# Patient Record
Sex: Male | Born: 1970 | Race: White | Hispanic: No | Marital: Married | Smoking: Never smoker
Health system: Southern US, Community
[De-identification: ages and names within clinical notes are randomized; demographics above are authoritative.]

---

## 2010-06-28 ENCOUNTER — Ambulatory Visit: Payer: Self-pay | Admitting: Sports Medicine

## 2010-06-28 DIAGNOSIS — M25569 Pain in unspecified knee: Secondary | ICD-10-CM

## 2011-01-02 ENCOUNTER — Ambulatory Visit
Admission: RE | Admit: 2011-01-02 | Discharge: 2011-01-02 | Payer: Self-pay | Source: Home / Self Care | Attending: Sports Medicine | Admitting: Sports Medicine

## 2011-01-02 DIAGNOSIS — M2170 Unequal limb length (acquired), unspecified site: Secondary | ICD-10-CM | POA: Insufficient documentation

## 2011-01-09 NOTE — Assessment & Plan Note (Signed)
Summary: LEG INJURY,MC   Vital Signs:  Patient profile:   40 year old male Height:      69 inches Weight:      177 pounds BMI:     26.23 Pulse rate:   67 / minute BP sitting:   130 / 75  (left arm) Cuff size:   regular  Vitals Entered By: Tessie Fass CMA (June 28, 2010 2:20 PM) CC: right knee injury   CC:  right knee injury.  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  crepitation noted along leteral patella and capsule and slight crepitation w flex/ext    Impression & Recommendations:  Problem # 1:  KNEE PAIN, RIGHT (ICD-719.46) suspect he had a cartilage contusion or other soft tissue injuey this knee has some residual crepitation from old surgery and injury  advised that this should continue healing over next 7 to 10 days if worsening or no change he will return and we will scan  OK to do any activity tolerated

## 2011-01-11 NOTE — Assessment & Plan Note (Signed)
Summary: ORTHOTICS/RT KNEE PAIN/RUNNER/MJD   Vital Signs:  Patient profile:   40 year old male Height:      69 inches Weight:      170 pounds BMI:     25.20 Pulse rate:   73 / minute BP sitting:   138 / 89  (right arm)  Vitals Entered By: Rochele Pages RN (January 02, 2011 10:03 AM) CC: rt lateral knee pain   CC:  rt lateral knee pain.  History of Present Illness: used to be a competitive runner.  injury to knee and surgery 1993, leg length discrepancy, orthotics and lift.  15 years since was a serious runner.  restarted in december now 5-10 week.  no pain until knee injury.  right knee making a clicking sound.  no weakness in knee, no locking.  hx of arthroscopy 15 years ago.  right knee- twisted knee in yard, lateral knee pain, worse going down stairs.  getting better in t erms of pain.  now hurts with full flexion and with heel strike. taking motrin for pain.  no sweelling at time fo injury  Preventive Screening-Counseling & Management  Alcohol-Tobacco     Smoking Status: never  Current Medications (verified): 1)  None  Allergies (verified): No Known Drug Allergies  Social History: Smoking Status:  never  Review of Systems  The patient denies fever, weight loss, and syncope.    Physical Exam  General:  Well-developed,well-nourished,in no acute distress; alert,appropriate and cooperative throughout examination Msk:  right knee with plica, crepitus.  ligaments intact with normal McMurray, lockman.  no swelling or heat.   rt leg slightly longer than left   Impression & Recommendations:  Problem # 1:  KNEE PAIN, RIGHT (ICD-719.46) Assessment Unchanged  risk of arthritic changes high in longer right leg, especially given previous surgery.   no evidence of acute injury.  will make orthotics today to provide support and correct leg lenght.   Orders: Orthotic Materials, each unit [phone])  Problem # 2:  UNEQUAL LEG LENGTH (ICD-736.81)  Patient was fitted for a :  standard, cushioned, semi-rigid orthotic. The orthotic was heated and afterward the patient stood on the orthotic blank positioned on the orthotic stand. The patient was positioned in subtalar neutral position and 10 degrees of ankle dorsiflexion in a weight bearing stance. After completion of molding, a stable base was applied to the orthotic blank. The blank was ground to a stable position for weight bearing. Size: 11 Base: blue swirl Posting: blue EVA Additional orthotic padding: Red brick with medial wedge on left.  Gait neutral with orthotics  even though his leg length is only 1 cm or so different this affects his gait and seems to trigger his knee pain  will follow up to see if this lessens his pain p 6 to 7 wks of use  Orders: Orthotic Materials, each unit (L3002)   Orders Added: 1)  Est. Patient Level III [60454] 2)  Orthotic Materials, each unit [L3002]

## 2011-03-09 ENCOUNTER — Inpatient Hospital Stay (INDEPENDENT_AMBULATORY_CARE_PROVIDER_SITE_OTHER)
Admission: RE | Admit: 2011-03-09 | Discharge: 2011-03-09 | Disposition: A | Discharge status: Home / Self Care | Payer: Self-pay | Source: Ambulatory Visit | Attending: Emergency Medicine | Admitting: Emergency Medicine

## 2011-03-09 DIAGNOSIS — J029 Acute pharyngitis, unspecified: Secondary | ICD-10-CM

## 2011-03-10 LAB — STREP A DNA PROBE

## 2013-06-20 ENCOUNTER — Emergency Department (HOSPITAL_COMMUNITY)
Admission: EM | Admit: 2013-06-20 | Discharge: 2013-06-21 | Disposition: A | Discharge status: Home / Self Care | Payer: BC Managed Care – PPO | Attending: Emergency Medicine | Admitting: Emergency Medicine

## 2013-06-20 DIAGNOSIS — W261XXA Contact with sword or dagger, initial encounter: Secondary | ICD-10-CM | POA: Insufficient documentation

## 2013-06-20 DIAGNOSIS — Y93G1 Activity, food preparation and clean up: Secondary | ICD-10-CM | POA: Insufficient documentation

## 2013-06-20 DIAGNOSIS — W260XXA Contact with knife, initial encounter: Secondary | ICD-10-CM | POA: Insufficient documentation

## 2013-06-20 DIAGNOSIS — S61209A Unspecified open wound of unspecified finger without damage to nail, initial encounter: Secondary | ICD-10-CM | POA: Insufficient documentation

## 2013-06-20 DIAGNOSIS — Y929 Unspecified place or not applicable: Secondary | ICD-10-CM | POA: Insufficient documentation

## 2013-06-20 NOTE — ED Notes (Signed)
Quick Clot applied per PA

## 2013-06-20 NOTE — ED Provider Notes (Signed)
   History  This chart was scribed for non-physician practitioner Francee Piccolo, PA-C, working with No att. providers found, by Yevette Edwards, ED Scribe. This patient was seen in room TR07C/TR07C and the patient's care was started at 10:04 PM.  CSN: [id number] Arrival date & time 06/20/13  2144  None    Chief Complaint  Patient presents with  . Laceration    The history is provided by the patient. No language interpreter was used.   HPI Comments: Joel Irwin is a 42 y.o. male who presents to the Emergency Department complaining of a laceration to his left thumb which occurred around an hour PTA when he was shaving an onion with a knife. The pt states that he sought medical attention because of uncontrolled bleeding to his thumb. He denies pain or numbness or tingling to his thumb or hand Denies fevers or chills or decreased sensation or ROM of thumb. His last tetanus was less than five years ago.   No past medical history on file. No past surgical history on file. No family history on file. History  Substance Use Topics  . Smoking status: Not on file  . Smokeless tobacco: Not on file  . Alcohol Use: Not on file    Review of Systems  Constitutional: Negative for fever and chills.  Skin: Positive for wound.  Neurological: Negative for numbness.  All other systems reviewed and are negative.    Allergies  Review of patient's allergies indicates no known allergies.  Home Medications   Current Outpatient Rx  Name  Route  Sig  Dispense  Refill  . Multiple Vitamins-Minerals (MULTIVITAMIN PO)   Oral   Take 1 tablet by mouth daily.          Triage Vitals: BP 139/80  Pulse 82  Temp(Src) 98.3 F (36.8 C) (Oral)  Resp 18  SpO2 98%  Physical Exam  Nursing note and vitals reviewed. Constitutional: He is oriented to person, place, and time. He appears well-developed and well-nourished. No distress.  HENT:  Head: Normocephalic and atraumatic.  Eyes: Conjunctivae  are normal.  Neck: Neck supple.  Musculoskeletal:       Left hand: He exhibits laceration. He exhibits normal range of motion, no tenderness, no bony tenderness, normal two-point discrimination, normal capillary refill, no deformity and no swelling. Normal sensation noted. Normal strength noted.  Less than 0.5 cm area of skin avulsion. No FB or bone visualized.   Neurological: He is alert and oriented to person, place, and time.  Skin: Skin is warm and dry. He is not diaphoretic.  Psychiatric: He has a normal mood and affect.    ED Course  Procedures (including critical care time)  DIAGNOSTIC STUDIES: Oxygen Saturation is 98% on room air, normal by my interpretation.    COORDINATION OF CARE:  10:30 PM- Discussed treatment plan with pt, and the pt agreed.   Labs Reviewed - No data to display No results found. 1. Avulsion of skin of finger without complication, initial encounter     MDM  Skin avulsion noted on examination, no bone or FB noted. Neurovascularly intact. Bleeding controlled in ED. Wound cleansed. Tdap UTD. Return precautions discussed. Advised f/u w/ PCP. Patient is agreeable to plan. Patient is stable at time of discharge    I personally performed the services described in this documentation, which was scribed in my presence. The recorded information has been reviewed and is accurate.     Lise Auer Monterrio Gerst, PA-C 06/20/13 2350

## 2013-06-20 NOTE — ED Notes (Signed)
Pt c/o LAC left thumb when shaving and onion. Last tetanus less than 5 years.

## 2013-06-20 NOTE — ED Provider Notes (Signed)
Medical screening examination/treatment/procedure(s) were performed by non-physician practitioner and as supervising physician I was immediately available for consultation/collaboration.   Richardean Canal, MD 06/20/13 [phone]

## 2015-02-23 ENCOUNTER — Ambulatory Visit
Admission: RE | Admit: 2015-02-23 | Discharge: 2015-02-23 | Disposition: A | Discharge status: Home / Self Care | Payer: BC Managed Care – PPO | Source: Ambulatory Visit | Attending: Sports Medicine | Admitting: Sports Medicine

## 2015-02-23 ENCOUNTER — Ambulatory Visit (INDEPENDENT_AMBULATORY_CARE_PROVIDER_SITE_OTHER): Payer: BC Managed Care – PPO | Admitting: Sports Medicine

## 2015-02-23 ENCOUNTER — Encounter: Payer: Self-pay | Admitting: Sports Medicine

## 2015-02-23 VITALS — BP 126/64 | Ht 69.0 in | Wt 165.0 lb

## 2015-02-23 DIAGNOSIS — M25461 Effusion, right knee: Secondary | ICD-10-CM

## 2015-02-23 DIAGNOSIS — M25561 Pain in right knee: Secondary | ICD-10-CM | POA: Diagnosis not present

## 2015-02-23 MED ORDER — MELOXICAM 15 MG PO TABS: ORAL_TABLET | ORAL | Status: AC

## 2015-03-02 DIAGNOSIS — M25469 Effusion, unspecified knee: Secondary | ICD-10-CM | POA: Insufficient documentation

## 2015-03-02 NOTE — Progress Notes (Signed)
  Joel SievertDenny L Pinedo - 44 y.o. male MRN [id number]  Date of birth: 11/28/1971  CHIEF COMPLAINT:  Right knee pain, initial evaluation  HISTORY OF PRESENT ILLNESS:  one week of acute knee pain and swelling after participating with his children at a trampoline park. Reports stiffness and swelling and symptoms of instability without ever giving way correct ever to No clicking, popping, locking or giving way. Has tried taking ibuprofen without significant improvement   REVIEW OF SYSTEMS:  denies numbness, tingling, luxury weakness. No significant diagnosed prior knee injuries but does report prior injury approximately 10 years ago with significant swelling after an incident of his knee giving away during a cutting and pivoting activity.   HISTORY:  otherwise healthy, non-smoker.  OBJECTIVE:  VS:   HT:5\' 9"  (175.3 cm)   WT:165 lb (74.844 kg)  BMI:24.4          BP:126/64 mmHg  HR: bpm  TEMP: ( )  RESP:   EXAM:  adult caucasian male.  No acute distress.  He is alert and appropriately interactive. RIGHT KNEE: moderate fusion, overall well line, no significant bruising.  No medial or lateral joint line tenderness.  Stable to varus and valgus strain. He does have laxity with a poor end point with anterior drawer and Lachman's.  Stable posterior drawer.  Negative McMurray's, negative Thessaly.    PROCEDURE NOTE : right knee arthrocentesis without Injection After discussing the risks, benefits and expected outcomes of the injection and all questions were reviewed and answered,  he wished to undergo the above named procedure.  Written consent was obtained. After an appropriate time out was taken the right knee was sterilely prepped and injected as below: Prep:    Betadine and alcohol,  Ethel chloride.  Approach:  Superior lateral Needle:  27g 1.5 inch needle for numbing, 18g 1.5 inch needle for aspiration. Meds:   None ~30cc straw-colored, clear fluid aspirated without difficulty A bandaid was  applied to the area. This procedure was well tolerated and there were no complications.     ASSESSMENT: 1. Pain in joint, lower leg, right   2. Knee effusion, right    Problem  Knee Effusion   concerns for potential of this damage from prior injury vs. degenerative changes. If not improved with aspiration, anti-inflammatories, resting, compression consider further advanced imaging       PLAN: See problem based charting & AVS for additional documentation. arthrocentesis today, compression bandage supplied. Start meloxicam. Obtain x-rays consider further evaluation at follow-up > Return in about 2 weeks (around 03/09/2015).   Meds ordered this encounter  Medications  . meloxicam (MOBIC) 15 MG tablet    Sig: Take one a day for 2 weeks then prn    Dispense:  30 tablet    Refill:  0    Orders Placed This Encounter  Procedures  . DG Knee Complete 4 Views Right

## 2015-03-23 ENCOUNTER — Ambulatory Visit: Payer: BC Managed Care – PPO | Admitting: Sports Medicine

## 2015-04-06 ENCOUNTER — Ambulatory Visit (INDEPENDENT_AMBULATORY_CARE_PROVIDER_SITE_OTHER): Payer: BC Managed Care – PPO | Admitting: Sports Medicine

## 2015-04-06 ENCOUNTER — Encounter: Payer: Self-pay | Admitting: Sports Medicine

## 2015-04-06 VITALS — BP 118/90 | Ht 69.0 in | Wt 165.0 lb

## 2015-04-06 DIAGNOSIS — M25461 Effusion, right knee: Secondary | ICD-10-CM | POA: Diagnosis not present

## 2015-04-06 DIAGNOSIS — M25561 Pain in right knee: Secondary | ICD-10-CM | POA: Diagnosis not present

## 2015-04-06 MED ORDER — METHYLPREDNISOLONE ACETATE 40 MG/ML IJ SUSP
40.0000 mg | Freq: Once | INTRAMUSCULAR | Status: AC
  Administered 2015-04-06: 40 mg via INTRA_ARTICULAR

## 2015-04-07 NOTE — Progress Notes (Signed)
  Joel Irwin - 44 y.o. male MRN [id number]  Date of birth: 12/13/1970  SUBJECTIVE: CC: 1.  right knee pain, follow-up      HPI: Interval history: Reports pain was moderate to severe today following aspiration but since and has significantly improved and he has been able to return to some moderate level of activity. Denies any significant clicking, popping or locking but is noted to swelling has been persistent. Denies any symptoms of instability. Meloxicam has been helpful.     ROS:  per HPI    HISTORY:  Past Medical, Surgical, Social, and Family History reviewed & updated per EMR.  Pertinent Historical Findings include: Social History   Occupational History  . Not on file.   Social History Main Topics  . Smoking status: Never Smoker   . Smokeless tobacco: Not on file  . Alcohol Use: Not on file  . Drug Use: Not on file  . Sexual Activity: Not on file    No specialty comments available. Problem  KNEE PAIN, RIGHT   Injury approximately 10 years ago, consistent with potential ACL injury Standing right knee x-ray 02/23/2015: No evidence of fracture, dislocation or significant effusion. No significant medial or lateral joint line degenerative change.     OBJECTIVE:  VS:   HT:5\' 9"  (175.3 cm)   WT:165 lb (74.844 kg)  BMI:24.4          BP:118/90 mmHg  HR: bpm  TEMP: ( )  RESP:   PHYSICAL EXAM: adult caucasian male.  No acute distress.  He is alert and appropriately interactive. RIGHT KNEE: moderate effusion but less than prior visit, overall well aligned, no significant bruising.  No medial or lateral joint line tenderness.  Stable to varus and valgus strain. He does have laxity with a moderate end point with anterior drawer and Lachman's.  Stable posterior drawer.  Negative McMurray's, negative Thessaly.     ASSESSMENT & PLAN: See problem based charting & AVS for additional documentation Problem List Items Addressed This Visit    KNEE PAIN, RIGHT - Primary    PROCEDURE  NOTE : Right knee aspiration and injection After discussing the risks, benefits and expected outcomes of the injection and all questions were reviewed and answered,  he wished to undergo the above named procedure.  Written consent was obtained. After an appropriate time out was taken the right knee was sterilely prepped and injected as below: Prep:    Betadine and alcohol,  Ethel chloride.  Anesthetic:  5 mL 1% lidocaine using 25-gauge 1.5 inch needle Approach:  Superior lateral Needle:  18-gauge Aspirate:  25 mL of straw-colored fluid Meds:   1 mL of 40 mg Depo-Medrol, 1 mL of half percent Marcaine 1 mL of 1% lidocaine A bandaid was applied to the area. This procedure was well tolerated and there were no complications.    Body Helix right knee compression sleeve provided today. Discussed using during activity as well as for the first 2 hours following activity and PRN.  Quadricep exercises, body helix compression sleeve. We'll see him back on an as needed basis.   If any significant worsening or no significant improvement MRI of the right knee would be indicated in he can call to have this scheduled prior to his next visit.      Relevant Medications   methylPREDNISolone acetate (DEPO-MEDROL) injection 40 mg (Completed)   Knee effusion      FOLLOW UP:  No Follow-up on file.

## 2015-04-07 NOTE — Assessment & Plan Note (Addendum)
PROCEDURE NOTE : Right knee aspiration and injection After discussing the risks, benefits and expected outcomes of the injection and all questions were reviewed and answered,  he wished to undergo the above named procedure.  Written consent was obtained. After an appropriate time out was taken the right knee was sterilely prepped and injected as below: Prep:    Betadine and alcohol,  Ethel chloride.  Anesthetic:  5 mL 1% lidocaine using 25-gauge 1.5 inch needle Approach:  Superior lateral Needle:  18-gauge Aspirate:  25 mL of straw-colored fluid Meds:   1 mL of 40 mg Depo-Medrol, 1 mL of half percent Marcaine 1 mL of 1% lidocaine A bandaid was applied to the area. This procedure was well tolerated and there were no complications.    Body Helix right knee compression sleeve provided today. Discussed using during activity as well as for the first 2 hours following activity and PRN.  Quadricep exercises, body helix compression sleeve. We'll see him back on an as needed basis.   If any significant worsening or no significant improvement MRI of the right knee would be indicated in he can call to have this scheduled prior to his next visit.

## 2017-10-15 ENCOUNTER — Ambulatory Visit: Payer: BC Managed Care – PPO | Admitting: Sports Medicine

## 2017-10-15 ENCOUNTER — Encounter: Payer: Self-pay | Admitting: Sports Medicine

## 2017-10-15 VITALS — BP 120/78 | Ht 69.0 in | Wt 175.0 lb

## 2017-10-15 DIAGNOSIS — M25461 Effusion, right knee: Secondary | ICD-10-CM | POA: Diagnosis not present

## 2017-10-15 DIAGNOSIS — M25561 Pain in right knee: Secondary | ICD-10-CM

## 2017-10-15 MED ORDER — DICLOFENAC SODIUM 75 MG PO TBEC: DELAYED_RELEASE_TABLET | ORAL | 0 refills | Status: AC

## 2017-10-16 ENCOUNTER — Encounter: Payer: Self-pay | Admitting: Sports Medicine
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
## 2017-10-29 ENCOUNTER — Ambulatory Visit (INDEPENDENT_AMBULATORY_CARE_PROVIDER_SITE_OTHER): Payer: BC Managed Care – PPO | Admitting: Sports Medicine

## 2017-10-29 ENCOUNTER — Encounter: Payer: Self-pay | Admitting: Sports Medicine

## 2017-10-29 VITALS — BP 119/77 | Ht 69.0 in | Wt 175.0 lb

## 2017-10-29 DIAGNOSIS — M25561 Pain in right knee: Secondary | ICD-10-CM

## 2017-10-29 NOTE — Progress Notes (Signed)
   Subjective:    Patient ID: Joel Irwin, male    DOB: 10/01/1971, 46 y.o.   MRN: [id number]  HPI   Joel Irwin comes in today for follow-up on his right knee injury. He was about 70% better until he went roller skating at his child's birthday party this past weekend. He is now about 50% better since his last office visit 2 weeks ago. He describes a generalized soreness in the knee when he is on his knee for extended periods of time. His swelling has subsided. He has discontinued his compression sleeve. He found it to be somewhat uncomfortable. He took his Voltaren for 7 days without any problems. He admits that he has not been compliant with his home exercises. He denies any locking or catching of the knee. Some slight instability but not bad.    Review of Systems As above    Objective:   Physical Exam  Well-developed, well-nourished. No acute distress. Awake alert and oriented 3. Vital signs reviewed  Right knee: Full range of motion. Previous effusion has resolved. Slight tenderness to palpation along the medial joint line with a mildly positive Thessaly's. No tenderness along the lateral joint line. Knee is stable to valgus and varus stressing but valgus stressing does cause some mild medial discomfort. Patient still has a 2+ anterior drawer and a 2+ Lachman. Neurovascularly intact distally. Walking with a slight limp.      Assessment & Plan:   Improving right knee pain  Overall, patient is improving. I discussed the merits of MRI but the patient would like to wait on that for now. I think he has a chronically torn anterior cruciate ligament and he may have a new meniscal tear but since he is improving we will simply stick with conservative treatment for now. I've encouraged him to be more compliant with his home exercises and I discussed the possibility of purchasing a double upright brace if he needs it for better support. He can also use his Voltaren as needed for pain. He may start to  increase activity as tolerated. If he does not return to his baseline over the next 3-4 weeks then I would reconsider merits of diagnostic imaging. Patient will follow-up for ongoing or recalcitrant issues.

## 2018-07-25 ENCOUNTER — Other Ambulatory Visit: Payer: Self-pay | Admitting: Family Medicine

## 2018-07-25 DIAGNOSIS — R918 Other nonspecific abnormal finding of lung field: Secondary | ICD-10-CM

## 2018-07-28 ENCOUNTER — Ambulatory Visit
Admission: RE | Admit: 2018-07-28 | Discharge: 2018-07-28 | Disposition: A | Discharge status: Home / Self Care | Payer: BC Managed Care – PPO | Source: Ambulatory Visit | Attending: Family Medicine | Admitting: Family Medicine

## 2018-07-28 DIAGNOSIS — R918 Other nonspecific abnormal finding of lung field: Secondary | ICD-10-CM

## 2018-07-28 IMAGING — CT CT CHEST W/ CM
1 series · 15 of 34 positions shown, 19 images · IV contrast (APPLIED)
Comparison: Chest radiographs most recent [DATE].  No prior CT.

CLINICAL DATA: Pneumonia in [REDACTED]. Follow-up of abnormal chest
radiograph. Nonsmoker without complaints. Right middle lobe
infiltrate.

EXAM:
CT CHEST WITH CONTRAST
TECHNIQUE: Multidetector CT imaging of the chest was performed during
intravenous contrast administration.
CONTRAST:  75mL [VW] IOPAMIDOL ([VW]) INJECTION 61%

[Series 2: chest w/cm · axial · 0.71mm/px · z∈[-325,-65]mm · 15 of 154 slices shown, 19 images]
[im 12/154  mediastinal]
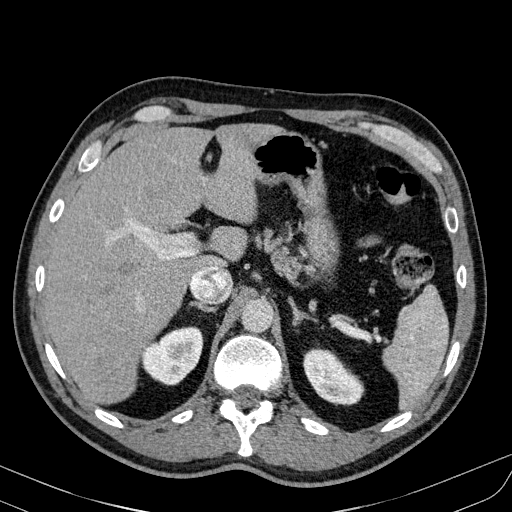
[im 12/154  lung]
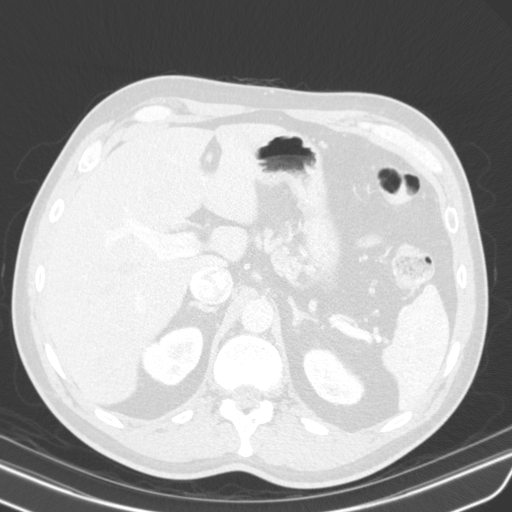
[im 23/154  lung]
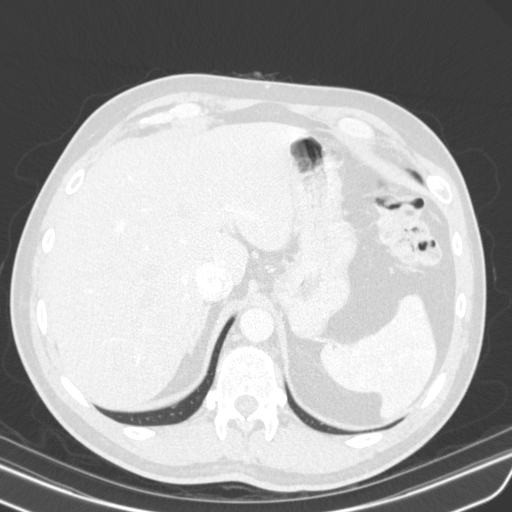
[im 31/154  lung]
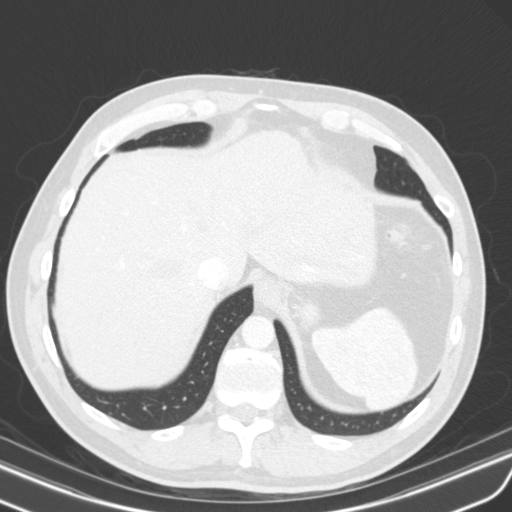
[im 40/154  lung]
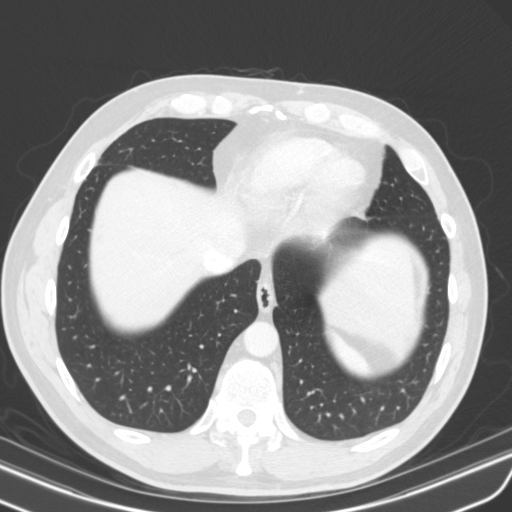
[im 52/154  mediastinal]
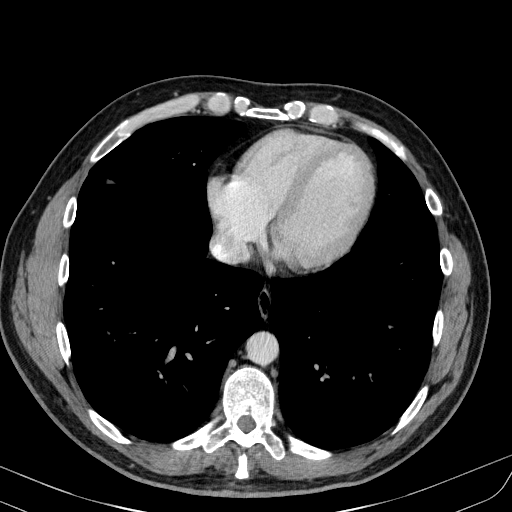
[im 52/154  lung]
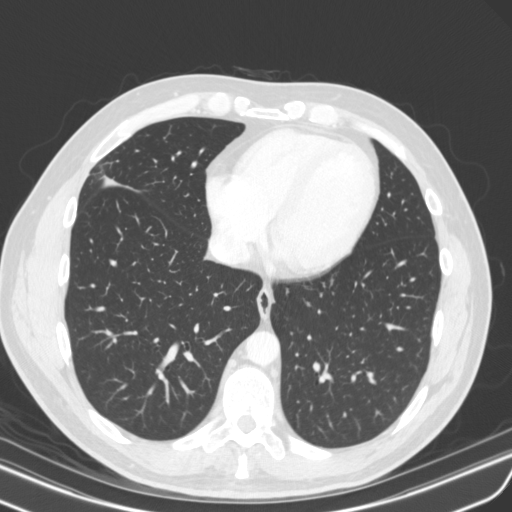
[im 62/154  lung]
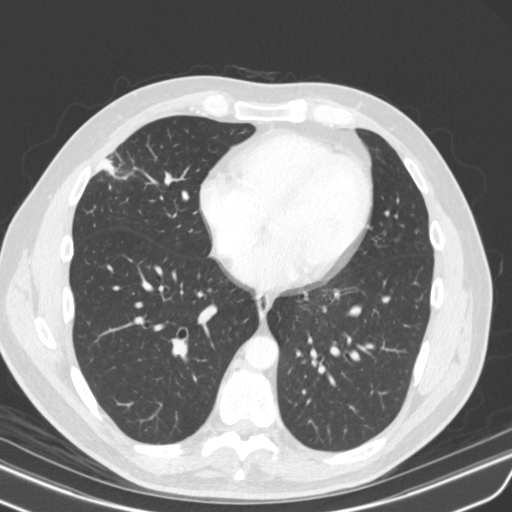
[im 69/154  lung]
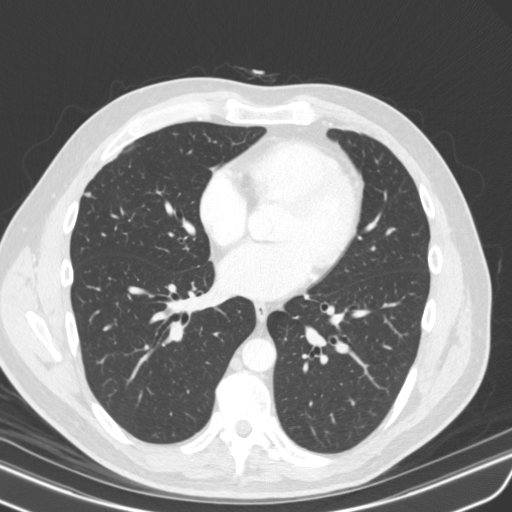
[im 80/154  lung]
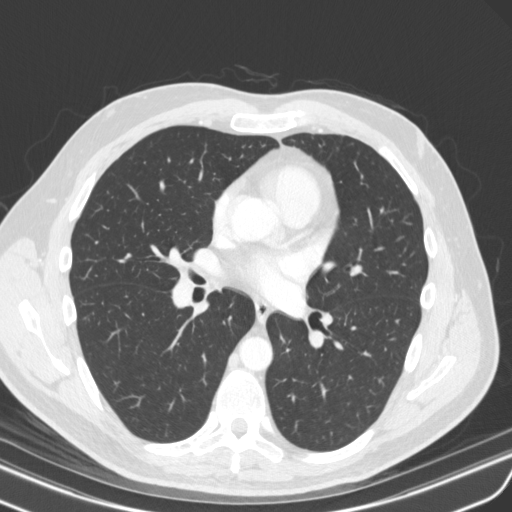
[im 86/154  mediastinal]
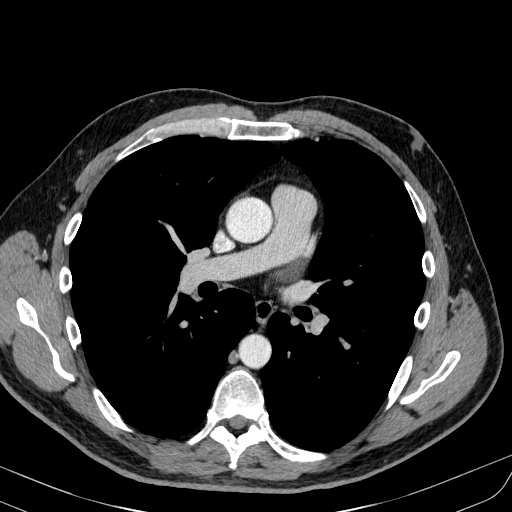
[im 86/154  lung]
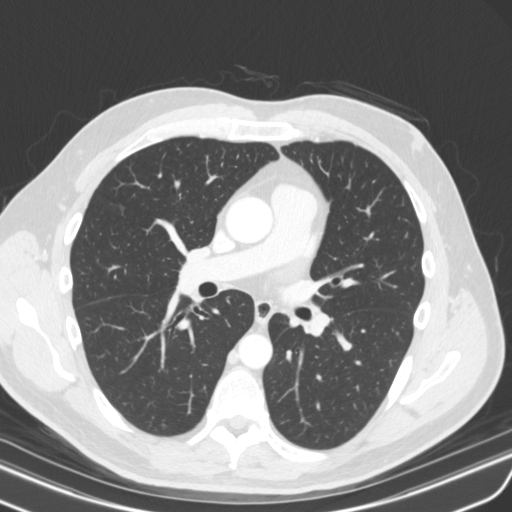
[im 92/154  lung]
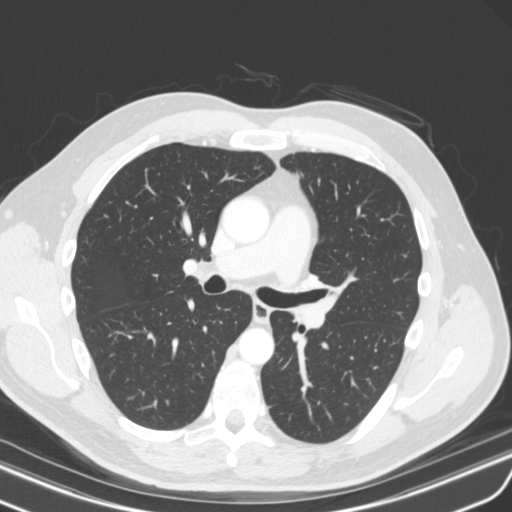
[im 103/154  lung]
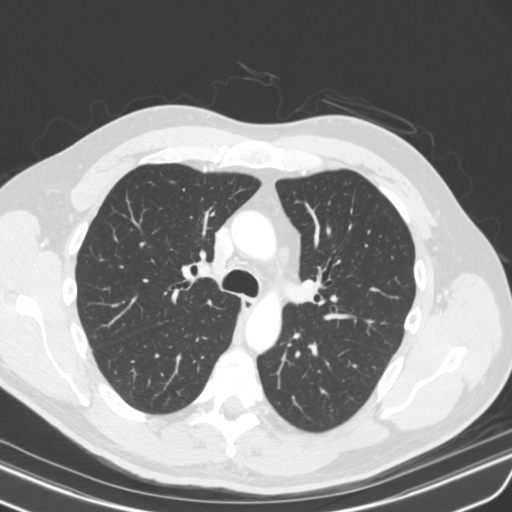
[im 114/154  lung]
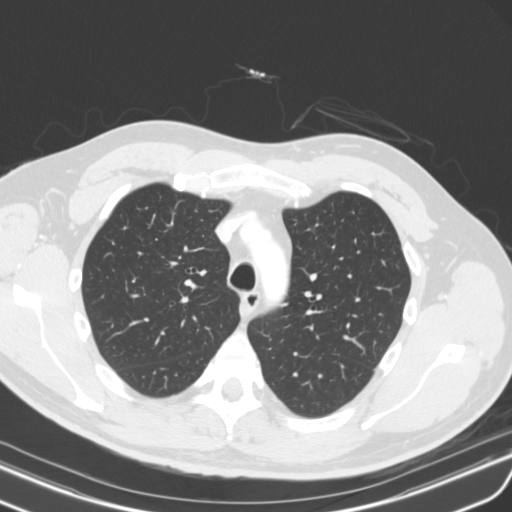
[im 123/154  mediastinal]
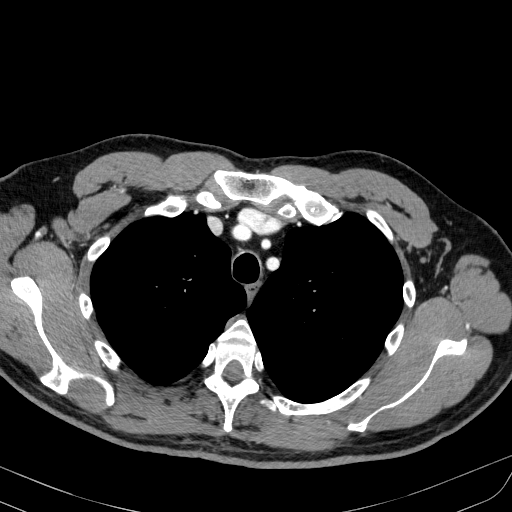
[im 123/154  lung]
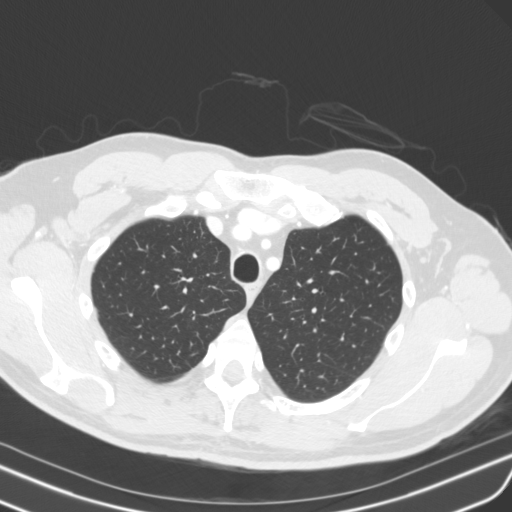
[im 131/154  lung]
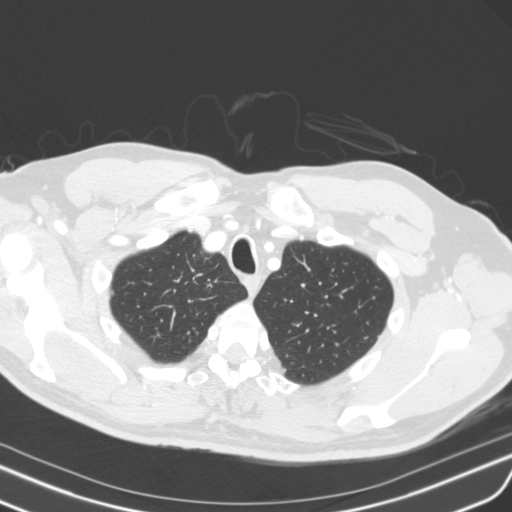
[im 142/154  lung]
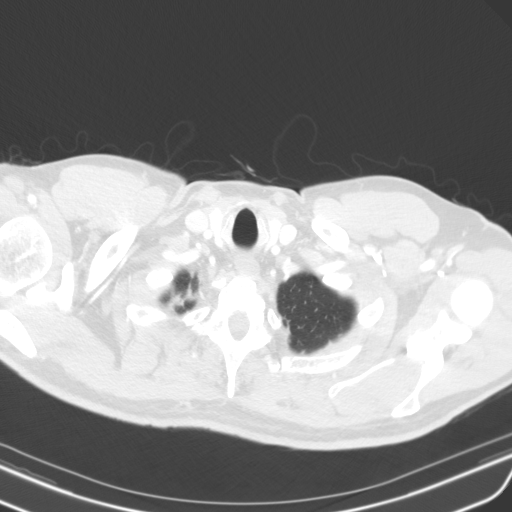

[15 of 34 positions shown; findings below may reference images not displayed]

FINDINGS: Cardiovascular: Normal caliber of the aorta and branch vessels.
Normal heart size, without pericardial effusion.

Mediastinum/Nodes: No mediastinal or hilar adenopathy.

Lungs/Pleura: No pleural fluid. Right middle lobe scar. No pneumonia
or mass. Right-sided pulmonary nodules including at 3 mm on image
34/5, 5 mm on image 70/5, and 3 mm on image 86/5.

Upper Abdomen: Variant lateral segment left liver lobe extending in
the left upper quadrant. Normal imaged portions of the spleen,
stomach, pancreas, gallbladder, right adrenal gland, left kidney.
Minimal left adrenal nodularity. An upper pole right renal lesion
measures fluid density and 1.4 cm, consistent with a cyst.

Musculoskeletal: Minimal right-sided gynecomastia. No acute osseous
abnormality.
IMPRESSION: 1. Right middle lobe scarring.  No suspicious finding.
2. Right-sided pulmonary nodules of maximally 5 mm. No follow-up
needed if patient is low-risk. Non-contrast chest CT can be
considered in 12 months if patient is high-risk. This recommendation
follows the consensus statement: Guidelines for Management of
Incidental Pulmonary Nodules Detected on CT Images: From the

## 2018-07-28 MED ORDER — IOPAMIDOL (ISOVUE-300) INJECTION 61%
75.0000 mL | Freq: Once | INTRAVENOUS | Status: AC | PRN
Start: 2018-07-28 — End: 2018-07-28
  Administered 2018-07-28: 75 mL via INTRAVENOUS
